# Patient Record
Sex: Male | Born: 1965 | Race: White | Hispanic: No | Marital: Married | Smoking: Never smoker
Health system: Southern US, Community
[De-identification: ages and names within clinical notes are randomized; demographics above are authoritative.]

## PROBLEM LIST (undated history)

## (undated) DIAGNOSIS — F419 Anxiety disorder, unspecified: Secondary | ICD-10-CM

## (undated) DIAGNOSIS — M1990 Unspecified osteoarthritis, unspecified site: Secondary | ICD-10-CM

## (undated) DIAGNOSIS — I10 Essential (primary) hypertension: Secondary | ICD-10-CM

## (undated) HISTORY — PX: VASECTOMY REVERSAL: SHX243

## (undated) HISTORY — PX: KNEE SURGERY: SHX244

## (undated) HISTORY — PX: HERNIA REPAIR: SHX51

---

## 2000-02-03 ENCOUNTER — Encounter: Payer: Self-pay | Admitting: Family Medicine

## 2000-02-03 ENCOUNTER — Encounter: Admission: RE | Admit: 2000-02-03 | Discharge: 2000-02-03 | Payer: Self-pay | Admitting: Family Medicine

## 2009-12-23 ENCOUNTER — Ambulatory Visit (HOSPITAL_BASED_OUTPATIENT_CLINIC_OR_DEPARTMENT_OTHER): Admission: RE | Admit: 2009-12-23 | Discharge: 2009-12-23 | Payer: Self-pay | Admitting: Urology

## 2010-03-13 ENCOUNTER — Emergency Department (HOSPITAL_COMMUNITY): Admission: EM | Admit: 2010-03-13 | Discharge: 2010-03-13 | Payer: Self-pay | Admitting: Emergency Medicine

## 2010-09-12 LAB — POCT HEMOGLOBIN-HEMACUE: Hemoglobin: 14.4 g/dL (ref 13.0–17.0)

## 2013-09-30 ENCOUNTER — Other Ambulatory Visit: Payer: Self-pay | Admitting: Family

## 2013-09-30 ENCOUNTER — Ambulatory Visit
Admission: RE | Admit: 2013-09-30 | Discharge: 2013-09-30 | Disposition: A | Discharge status: Home / Self Care | Payer: No Typology Code available for payment source | Source: Ambulatory Visit | Attending: Family | Admitting: Family

## 2013-09-30 DIAGNOSIS — M545 Low back pain, unspecified: Secondary | ICD-10-CM

## 2013-09-30 DIAGNOSIS — M25552 Pain in left hip: Secondary | ICD-10-CM

## 2013-11-11 ENCOUNTER — Ambulatory Visit
Admission: RE | Admit: 2013-11-11 | Discharge: 2013-11-11 | Disposition: A | Discharge status: Home / Self Care | Payer: Worker's Compensation | Source: Ambulatory Visit | Attending: Orthopedic Surgery | Admitting: Orthopedic Surgery

## 2013-11-11 ENCOUNTER — Other Ambulatory Visit: Payer: Self-pay | Admitting: Orthopedic Surgery

## 2013-11-11 DIAGNOSIS — S0095XA Superficial foreign body of unspecified part of head, initial encounter: Secondary | ICD-10-CM

## 2014-01-30 ENCOUNTER — Other Ambulatory Visit: Payer: Self-pay | Admitting: Orthopedic Surgery

## 2014-01-30 DIAGNOSIS — M545 Low back pain, unspecified: Secondary | ICD-10-CM

## 2014-02-05 ENCOUNTER — Ambulatory Visit
Admission: RE | Admit: 2014-02-05 | Discharge: 2014-02-05 | Disposition: A | Discharge status: Home / Self Care | Payer: Worker's Compensation | Source: Ambulatory Visit | Attending: Orthopedic Surgery | Admitting: Orthopedic Surgery

## 2014-02-05 DIAGNOSIS — M545 Low back pain, unspecified: Secondary | ICD-10-CM

## 2014-02-05 MED ORDER — METHYLPREDNISOLONE ACETATE 40 MG/ML INJ SUSP (RADIOLOG
120.0000 mg | Freq: Once | INTRAMUSCULAR | Status: AC
  Administered 2014-02-05: 120 mg via INTRA_ARTICULAR

## 2015-06-17 IMAGING — CT CT BIOPSY
1 series · 15 of 25 positions shown, 19 images · non-contrast
Comparison: none

CLINICAL DATA: Left-sided low back pain.

[Series 3: pelvis standard · axial · 0.70mm/px · z∈[-86,-35]mm · 15 of 25 slices shown, 19 images]
[im 2/25  soft-tissue]
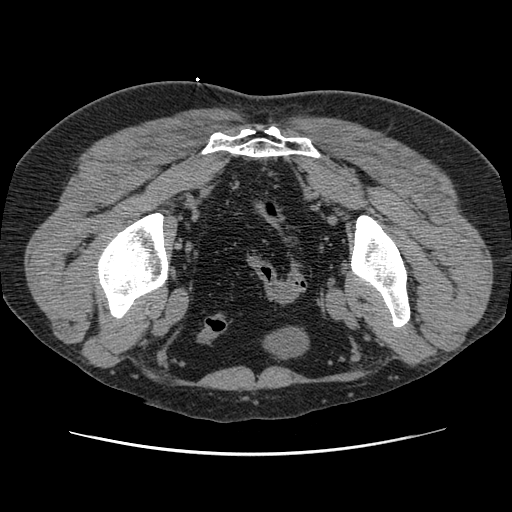
[im 2/25  bone]
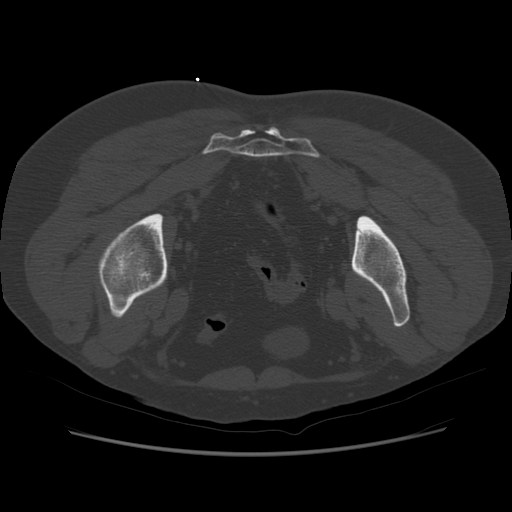
[im 4/25  soft-tissue]
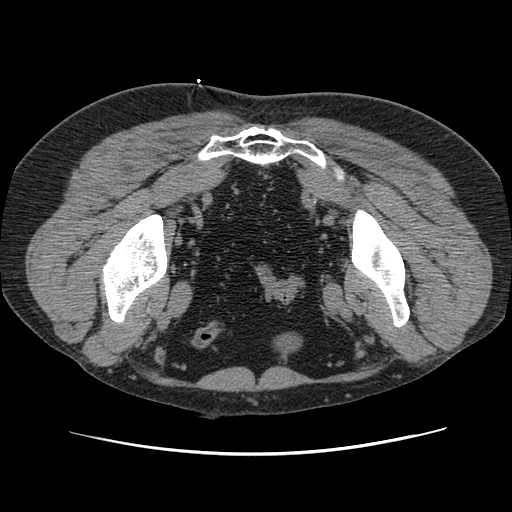
[im 6/25  soft-tissue]
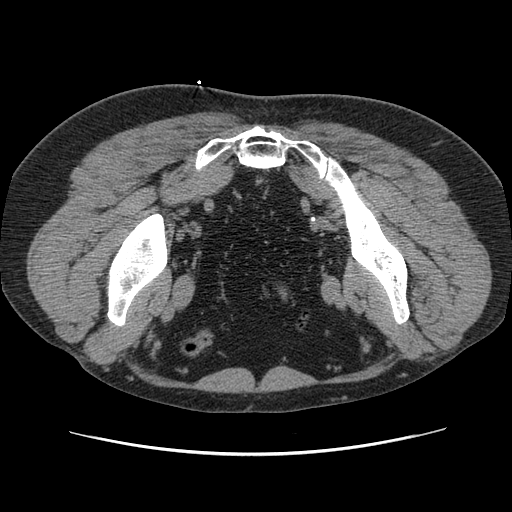
[im 8/25  soft-tissue]
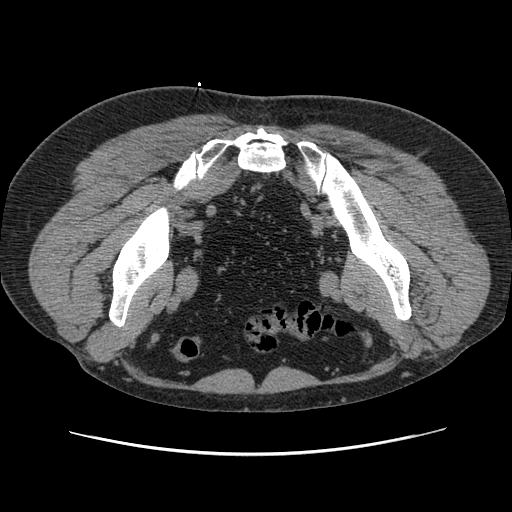
[im 9/25  soft-tissue]
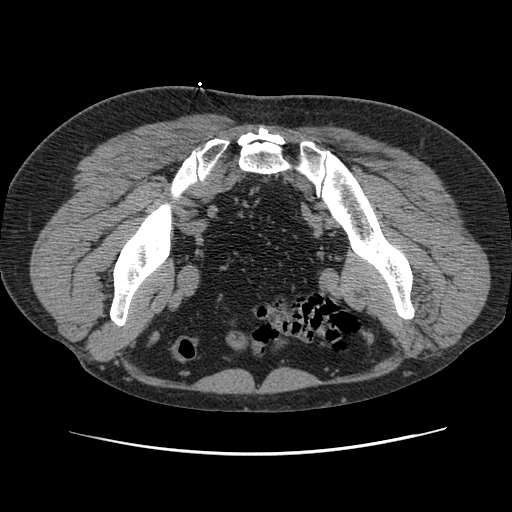
[im 11/25  soft-tissue]
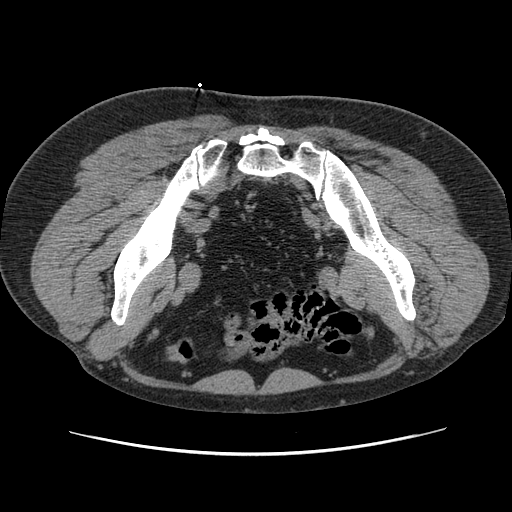
[im 13/25  soft-tissue]
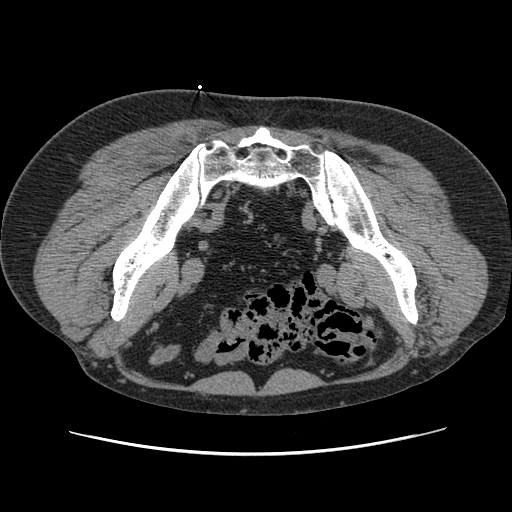
[im 15/25  soft-tissue]
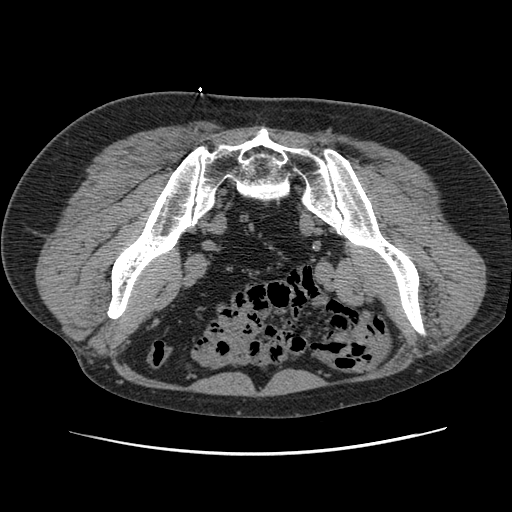
[im 17/25  soft-tissue]
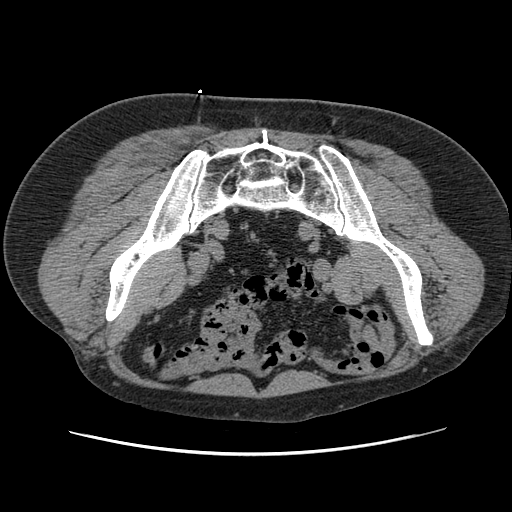
[im 17/25  bone]
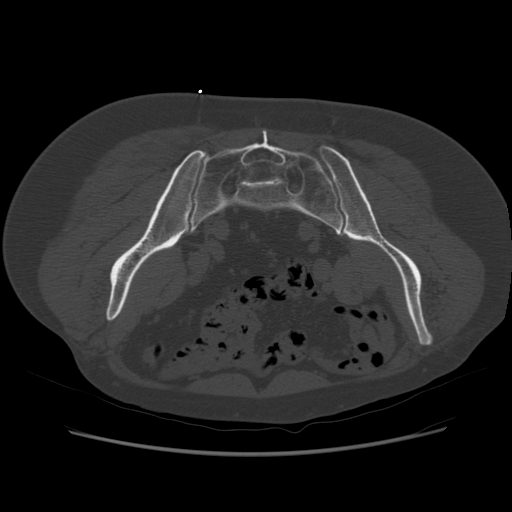
[im 18/25  soft-tissue]
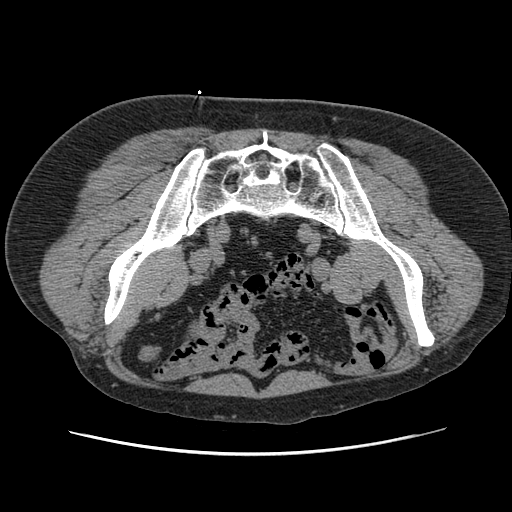
[im 20/25  soft-tissue]
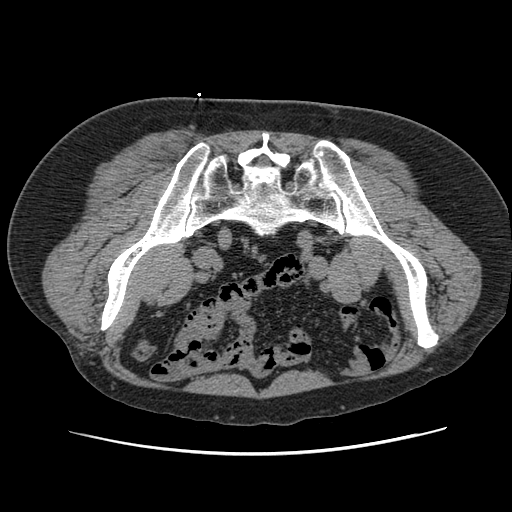
[im 21/25  lung]
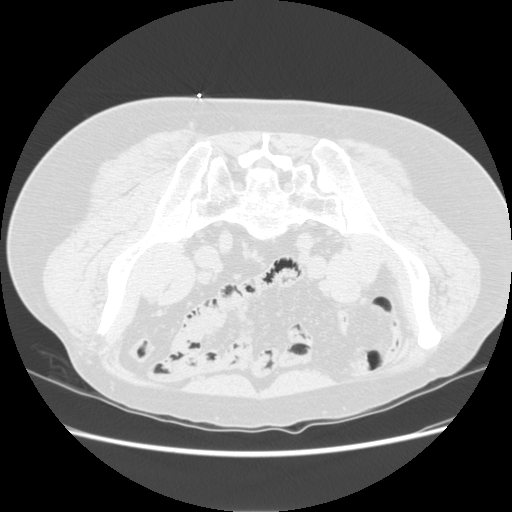
[im 22/25  soft-tissue]
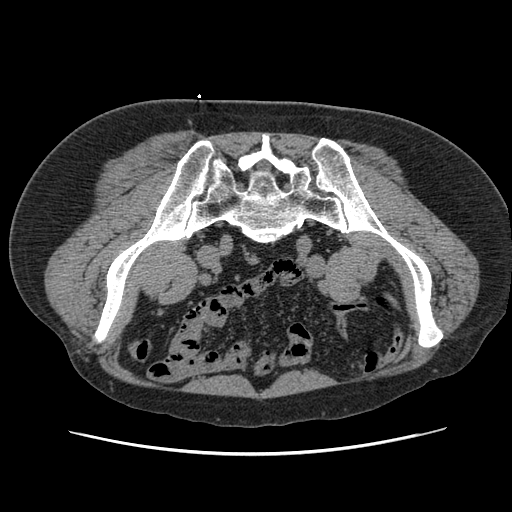
[im 22/25  lung]
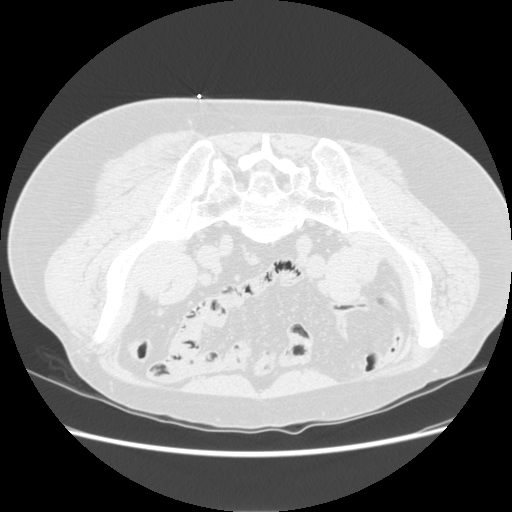
[im 23/25  lung]
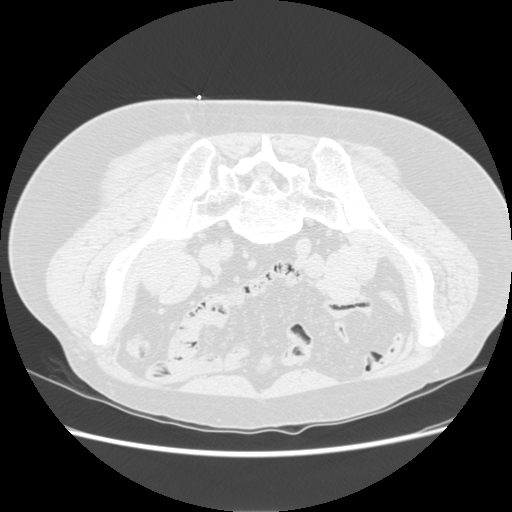
[im 24/25  soft-tissue]
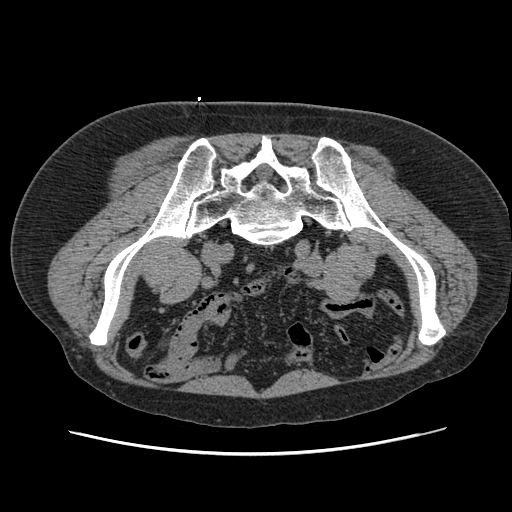
[im 24/25  lung]
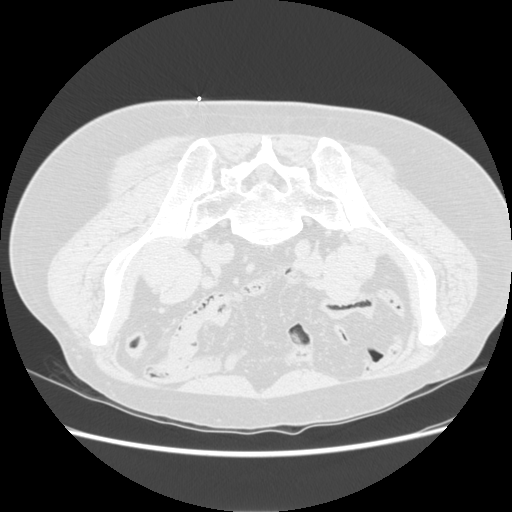

[15 of 25 positions shown; findings below may reference images not displayed]

PROCEDURE:
CT-GUIDED LEFT SI JOINT INJECTION.

After a thorough discussion of risks and benefits of the procedure,
including bleeding, infection, injury to nerves, blood vessels, and
adjacent structures, verbal and written consent was obtained.
Specific risks of the procedure included
nondiagnostic/nontherapeutic injection and non target injection. The
patient was placed prone on the CT table and localization was
performed over the sacrum. Target site marked. The skin was prepped
and draped in the usual sterile fashion using Betadine soap.

After local anesthesia with 1% lidocaine without epinephrine and
subsequent deep anesthesia, a 3.5 inch 22 gauge spinal needle was
advanced into the left SI joint under intermittent CT guidance.
Entrance into the joint was technically difficult due to overhanging
osteophytes, necessitating a medial to lateral approach into the
posterior aspect of the joint, which prevented advancement deeper
into the joint. Injection of 0.5 ml Omnipaque 180 opacified the
posterior aspect of the joint deep to the osteophytes. No vascular
uptake present. Subsequently, 120 of Depo-Medrol mixed with 0.5 mL
of 1% lidocaine was injected into SI joint. The patient's pain was
reproduced during the injection. Needle was removed and a sterile
dressing applied.

No complications were observed. The patient was observed and
released under the care of a driver after 30 minutes.
IMPRESSION: Successful CT-guided left SI joint injection.

## 2016-06-14 ENCOUNTER — Ambulatory Visit: Payer: Self-pay | Admitting: Urology

## 2017-02-02 ENCOUNTER — Ambulatory Visit: Payer: Self-pay | Admitting: General Surgery

## 2017-02-07 ENCOUNTER — Ambulatory Visit: Payer: Self-pay | Admitting: General Surgery

## 2017-03-01 NOTE — Progress Notes (Addendum)
SUN:HRVACQP, Herbie [place], MD  Cardiologist: pt denies  EKG: pt denies past year  Stress test: pt denies ever  ECHO: pt denies ever  Cardiac Cath: pt denies ever  Chest x-ray: pt denies past year

## 2017-03-01 NOTE — Pre-Procedure Instructions (Addendum)
Reginald Odom  03/01/2017      [place] [place] - [id number] [place] [address] Phone: [phone] Fax: [phone]    Your procedure is scheduled on March 06, 2017.Marland Kitchen  Report to [facility] Admitting at 1230 PM.  Call this number if you have problems the morning of surgery:  [phone]   Remember:  Do not eat food or drink liquids after midnight.  Take these medicines the morning of surgery with A SIP OF WATER acetaminophen (tylenol), alprazolam (xanax)-if needed.  7 days prior to surgery STOP taking any Aspirin, Aleve, Naproxen, Ibuprofen, Motrin, Advil, Goody's, BC's, all herbal medications, fish oil, and all vitamins   Do not wear jewelry, make-up or nail polish.  Do not wear lotions, powders, or perfumes, or deoderant.  Men may shave face and neck.  Do not bring valuables to the hospital.  [facility] is not responsible for any belongings or valuables.  Contacts, dentures or bridgework may not be worn into surgery.  Leave your suitcase in the car.  After surgery it may be brought to your room.  For patients admitted to the hospital, discharge time will be determined by your treatment team.  Patients discharged the day of surgery will not be allowed to drive home.   Special instructions:   [place]- Preparing For Surgery  Before surgery, you can play an important role. Because skin is not sterile, your skin needs to be as free of germs as possible. You can reduce the number of germs on your skin by washing with CHG (chlorahexidine gluconate) Soap before surgery.  CHG is an antiseptic cleaner which kills germs and bonds with the skin to continue killing germs even after washing.  Please do not use if you have an allergy to CHG or antibacterial soaps. If your skin becomes reddened/irritated stop using the CHG.  Do not shave (including legs and underarms) for at least 48 hours prior to first CHG  shower. It is OK to shave your face.  Please follow these instructions carefully.   1. Shower the NIGHT BEFORE SURGERY and the MORNING OF SURGERY with CHG.   2. If you chose to wash your hair, wash your hair first as usual with your normal shampoo.  3. After you shampoo, rinse your hair and body thoroughly to remove the shampoo.  4. Use CHG as you would any other liquid soap. You can apply CHG directly to the skin and wash gently with a scrungie or a clean washcloth.   5. Apply the CHG Soap to your body ONLY FROM THE NECK DOWN.  Do not use on open wounds or open sores. Avoid contact with your eyes, ears, mouth and genitals (private parts). Wash genitals (private parts) with your normal soap.  6. Wash thoroughly, paying special attention to the area where your surgery will be performed.  7. Thoroughly rinse your body with warm water from the neck down.  8. DO NOT shower/wash with your normal soap after using and rinsing off the CHG Soap.  9. Pat yourself dry with a CLEAN TOWEL.   10. Wear CLEAN PAJAMAS   11. Place CLEAN SHEETS on your bed the night of your first shower and DO NOT SLEEP WITH PETS.    Day of Surgery: Do not apply any deodorants/lotions. Please wear clean clothes to the hospital/surgery center.     Please read over the following fact sheets that you were  given. Pain Booklet, Coughing and Deep Breathing and Surgical Site Infection Prevention

## 2017-03-02 ENCOUNTER — Encounter (HOSPITAL_COMMUNITY)
Admission: RE | Admit: 2017-03-02 | Discharge: 2017-03-02 | Disposition: A | Discharge status: Home / Self Care | Payer: 59 | Source: Ambulatory Visit | Attending: General Surgery | Admitting: General Surgery

## 2017-03-02 ENCOUNTER — Encounter (HOSPITAL_COMMUNITY): Payer: Self-pay

## 2017-03-02 DIAGNOSIS — Z01810 Encounter for preprocedural cardiovascular examination: Secondary | ICD-10-CM | POA: Diagnosis not present

## 2017-03-02 DIAGNOSIS — K4091 Unilateral inguinal hernia, without obstruction or gangrene, recurrent: Secondary | ICD-10-CM | POA: Insufficient documentation

## 2017-03-02 HISTORY — DX: Essential (primary) hypertension: I10

## 2017-03-02 HISTORY — DX: Anxiety disorder, unspecified: F41.9

## 2017-03-02 HISTORY — DX: Unspecified osteoarthritis, unspecified site: M19.90

## 2017-03-02 LAB — BASIC METABOLIC PANEL
ANION GAP: 8 (ref 5–15)
BUN: 11 mg/dL (ref 6–20)
CALCIUM: 9 mg/dL (ref 8.9–10.3)
CO2: 28 mmol/L (ref 22–32)
Chloride: 102 mmol/L (ref 101–111)
Creatinine, Ser: 0.91 mg/dL (ref 0.61–1.24)
GFR calc Af Amer: >60 mL/min (ref >60)
GFR calc non Af Amer: >60 mL/min (ref >60)
Glucose, Bld: 100 mg/dL — ABNORMAL HIGH (ref 65–99)
Potassium: 3.8 mmol/L (ref 3.5–5.1)
SODIUM: 138 mmol/L (ref 135–145)

## 2017-03-02 LAB — CBC
HCT: 49.1 % (ref 39.0–52.0)
Hemoglobin: 16.4 g/dL (ref 13.0–17.0)
MCH: 28 pg (ref 26.0–34.0)
MCHC: 33.4 g/dL (ref 30.0–36.0)
MCV: 83.9 fL (ref 78.0–100.0)
PLATELETS: 168 10*3/uL (ref 150–400)
RBC: 5.85 MIL/uL — ABNORMAL HIGH (ref 4.22–5.81)
RDW: 12.8 % (ref 11.5–15.5)
WBC: 6.3 10*3/uL (ref 4.0–10.5)

## 2017-03-05 MED ORDER — ACETAMINOPHEN 500 MG PO TABS
1000.0000 mg | ORAL_TABLET | ORAL | Status: AC
  Administered 2017-03-06: 1000 mg via ORAL
  Filled 2017-03-05: qty 2

## 2017-03-05 MED ORDER — GABAPENTIN 300 MG PO CAPS
300.0000 mg | ORAL_CAPSULE | ORAL | Status: AC
  Administered 2017-03-06: 300 mg via ORAL
  Filled 2017-03-05: qty 1

## 2017-03-05 MED ORDER — CELECOXIB 200 MG PO CAPS
400.0000 mg | ORAL_CAPSULE | ORAL | Status: AC
  Administered 2017-03-06: 400 mg via ORAL
  Filled 2017-03-05: qty 2

## 2017-03-06 ENCOUNTER — Ambulatory Visit (HOSPITAL_COMMUNITY)
Admission: RE | Admit: 2017-03-06 | Discharge: 2017-03-06 | Disposition: A | Discharge status: Home / Self Care | Payer: 59 | Source: Ambulatory Visit | Attending: General Surgery | Admitting: General Surgery

## 2017-03-06 ENCOUNTER — Ambulatory Visit (HOSPITAL_COMMUNITY): Payer: 59 | Admitting: Certified Registered Nurse Anesthetist

## 2017-03-06 ENCOUNTER — Encounter (HOSPITAL_COMMUNITY): Payer: Self-pay | Admitting: Certified Registered Nurse Anesthetist

## 2017-03-06 ENCOUNTER — Encounter (HOSPITAL_COMMUNITY)
Admission: RE | Disposition: A | Discharge status: Home / Self Care | Payer: Self-pay | Source: Ambulatory Visit | Attending: General Surgery

## 2017-03-06 DIAGNOSIS — Z8673 Personal history of transient ischemic attack (TIA), and cerebral infarction without residual deficits: Secondary | ICD-10-CM | POA: Diagnosis not present

## 2017-03-06 DIAGNOSIS — D176 Benign lipomatous neoplasm of spermatic cord: Secondary | ICD-10-CM | POA: Diagnosis not present

## 2017-03-06 DIAGNOSIS — K4091 Unilateral inguinal hernia, without obstruction or gangrene, recurrent: Secondary | ICD-10-CM | POA: Insufficient documentation

## 2017-03-06 DIAGNOSIS — J45909 Unspecified asthma, uncomplicated: Secondary | ICD-10-CM | POA: Insufficient documentation

## 2017-03-06 DIAGNOSIS — M1990 Unspecified osteoarthritis, unspecified site: Secondary | ICD-10-CM | POA: Insufficient documentation

## 2017-03-06 DIAGNOSIS — E079 Disorder of thyroid, unspecified: Secondary | ICD-10-CM | POA: Insufficient documentation

## 2017-03-06 DIAGNOSIS — R011 Cardiac murmur, unspecified: Secondary | ICD-10-CM | POA: Insufficient documentation

## 2017-03-06 DIAGNOSIS — K219 Gastro-esophageal reflux disease without esophagitis: Secondary | ICD-10-CM | POA: Insufficient documentation

## 2017-03-06 DIAGNOSIS — Z833 Family history of diabetes mellitus: Secondary | ICD-10-CM | POA: Diagnosis not present

## 2017-03-06 DIAGNOSIS — M549 Dorsalgia, unspecified: Secondary | ICD-10-CM | POA: Diagnosis not present

## 2017-03-06 DIAGNOSIS — Z8249 Family history of ischemic heart disease and other diseases of the circulatory system: Secondary | ICD-10-CM | POA: Insufficient documentation

## 2017-03-06 DIAGNOSIS — Z79899 Other long term (current) drug therapy: Secondary | ICD-10-CM | POA: Insufficient documentation

## 2017-03-06 DIAGNOSIS — I110 Hypertensive heart disease with heart failure: Secondary | ICD-10-CM | POA: Insufficient documentation

## 2017-03-06 DIAGNOSIS — I4891 Unspecified atrial fibrillation: Secondary | ICD-10-CM | POA: Diagnosis not present

## 2017-03-06 DIAGNOSIS — Z880 Allergy status to penicillin: Secondary | ICD-10-CM | POA: Diagnosis not present

## 2017-03-06 HISTORY — PX: INGUINAL HERNIA REPAIR: SHX194

## 2017-03-06 HISTORY — PX: INSERTION OF MESH: SHX5868

## 2017-03-06 SURGERY — REPAIR, HERNIA, INGUINAL, ADULT
Anesthesia: General | Site: Abdomen | Laterality: Left

## 2017-03-06 MED ORDER — FENTANYL CITRATE (PF) 250 MCG/5ML IJ SOLN
INTRAMUSCULAR | Status: AC
  Filled 2017-03-06: qty 5

## 2017-03-06 MED ORDER — DEXAMETHASONE SODIUM PHOSPHATE 10 MG/ML IJ SOLN
INTRAMUSCULAR | Status: DC | PRN
  Administered 2017-03-06: 10 mg via INTRAVENOUS

## 2017-03-06 MED ORDER — PROPOFOL 10 MG/ML IV BOLUS
INTRAVENOUS | Status: AC
  Filled 2017-03-06: qty 20

## 2017-03-06 MED ORDER — PROPOFOL 10 MG/ML IV BOLUS
INTRAVENOUS | Status: DC | PRN
  Administered 2017-03-06: 200 mg via INTRAVENOUS

## 2017-03-06 MED ORDER — FENTANYL CITRATE (PF) 100 MCG/2ML IJ SOLN
INTRAMUSCULAR | Status: DC | PRN
  Administered 2017-03-06: 50 ug via INTRAVENOUS
  Administered 2017-03-06 (×2): 100 ug via INTRAVENOUS

## 2017-03-06 MED ORDER — EPHEDRINE 5 MG/ML INJ
INTRAVENOUS | Status: AC
  Filled 2017-03-06: qty 10

## 2017-03-06 MED ORDER — BUPIVACAINE-EPINEPHRINE (PF) 0.25% -1:200000 IJ SOLN
INTRAMUSCULAR | Status: AC
  Filled 2017-03-06: qty 30

## 2017-03-06 MED ORDER — CHLORHEXIDINE GLUCONATE CLOTH 2 % EX PADS: 6.0000 | MEDICATED_PAD | Freq: Once | CUTANEOUS | Status: DC

## 2017-03-06 MED ORDER — NEOSTIGMINE METHYLSULFATE 5 MG/5ML IV SOSY
PREFILLED_SYRINGE | INTRAVENOUS | Status: AC
  Filled 2017-03-06: qty 5

## 2017-03-06 MED ORDER — NEOSTIGMINE METHYLSULFATE 10 MG/10ML IV SOLN
INTRAVENOUS | Status: DC | PRN
  Administered 2017-03-06: 4 mg via INTRAVENOUS

## 2017-03-06 MED ORDER — OXYCODONE HCL 5 MG PO TABS
5.0000 mg | ORAL_TABLET | Freq: Once | ORAL | Status: AC
  Administered 2017-03-06: 5 mg via ORAL

## 2017-03-06 MED ORDER — MIDAZOLAM HCL 5 MG/5ML IJ SOLN
INTRAMUSCULAR | Status: DC | PRN
  Administered 2017-03-06: 2 mg via INTRAVENOUS

## 2017-03-06 MED ORDER — LIDOCAINE HCL (CARDIAC) 20 MG/ML IV SOLN
INTRAVENOUS | Status: DC | PRN
  Administered 2017-03-06: 80 mg via INTRAVENOUS

## 2017-03-06 MED ORDER — 0.9 % SODIUM CHLORIDE (POUR BTL) OPTIME
TOPICAL | Status: DC | PRN
  Administered 2017-03-06: 1000 mL

## 2017-03-06 MED ORDER — GLYCOPYRROLATE 0.2 MG/ML IJ SOLN
INTRAMUSCULAR | Status: DC | PRN
  Administered 2017-03-06: 0.6 mg via INTRAVENOUS

## 2017-03-06 MED ORDER — MIDAZOLAM HCL 2 MG/2ML IJ SOLN
INTRAMUSCULAR | Status: AC
  Filled 2017-03-06: qty 2

## 2017-03-06 MED ORDER — MEPERIDINE HCL 25 MG/ML IJ SOLN: 6.2500 mg | INTRAMUSCULAR | Status: DC | PRN

## 2017-03-06 MED ORDER — BUPIVACAINE-EPINEPHRINE (PF) 0.5% -1:200000 IJ SOLN
INTRAMUSCULAR | Status: AC
  Filled 2017-03-06: qty 30

## 2017-03-06 MED ORDER — BUPIVACAINE-EPINEPHRINE 0.25% -1:200000 IJ SOLN
INTRAMUSCULAR | Status: DC | PRN
  Administered 2017-03-06: 20 mL

## 2017-03-06 MED ORDER — CIPROFLOXACIN IN D5W 400 MG/200ML IV SOLN
400.0000 mg | Freq: Once | INTRAVENOUS | Status: AC
  Administered 2017-03-06: 400 mg via INTRAVENOUS
  Filled 2017-03-06: qty 200

## 2017-03-06 MED ORDER — OXYCODONE HCL 5 MG PO TABS
ORAL_TABLET | ORAL | Status: AC
  Administered 2017-03-06: 5 mg via ORAL
  Filled 2017-03-06: qty 1

## 2017-03-06 MED ORDER — CIPROFLOXACIN IN D5W 400 MG/200ML IV SOLN
INTRAVENOUS | Status: AC
  Filled 2017-03-06: qty 200

## 2017-03-06 MED ORDER — LACTATED RINGERS IV SOLN
INTRAVENOUS | Status: DC
Start: 2017-03-06 — End: 2017-03-06
  Administered 2017-03-06: 14:00:00 via INTRAVENOUS

## 2017-03-06 MED ORDER — PHENYLEPHRINE 40 MCG/ML (10ML) SYRINGE FOR IV PUSH (FOR BLOOD PRESSURE SUPPORT)
PREFILLED_SYRINGE | INTRAVENOUS | Status: AC
  Filled 2017-03-06: qty 10

## 2017-03-06 MED ORDER — ROCURONIUM BROMIDE 100 MG/10ML IV SOLN
INTRAVENOUS | Status: DC | PRN
  Administered 2017-03-06: 50 mg via INTRAVENOUS

## 2017-03-06 MED ORDER — FENTANYL CITRATE (PF) 100 MCG/2ML IJ SOLN: 25.0000 ug | INTRAMUSCULAR | Status: DC | PRN

## 2017-03-06 MED ORDER — OXYCODONE HCL 5 MG PO TABS: 5.0000 mg | ORAL_TABLET | Freq: Four times a day (QID) | ORAL | 0 refills | Status: AC | PRN

## 2017-03-06 MED ORDER — ONDANSETRON HCL 4 MG/2ML IJ SOLN: 4.0000 mg | Freq: Once | INTRAMUSCULAR | Status: DC | PRN

## 2017-03-06 MED ORDER — ONDANSETRON HCL 4 MG/2ML IJ SOLN
INTRAMUSCULAR | Status: DC | PRN
  Administered 2017-03-06: 4 mg via INTRAVENOUS

## 2017-03-06 SURGICAL SUPPLY — 47 items
ADH SKN CLS APL DERMABOND .7 (GAUZE/BANDAGES/DRESSINGS) ×1
BLADE CLIPPER SURG (BLADE) IMPLANT
BLADE SURG 10 STRL SS (BLADE) ×3 IMPLANT
BLADE SURG 15 STRL LF DISP TIS (BLADE) ×1 IMPLANT
BLADE SURG 15 STRL SS (BLADE) ×3
CANISTER SUCT 3000ML PPV (MISCELLANEOUS) IMPLANT
CHLORAPREP W/TINT 26ML (MISCELLANEOUS) ×3 IMPLANT
COVER SURGICAL LIGHT HANDLE (MISCELLANEOUS) ×3 IMPLANT
DERMABOND ADVANCED (GAUZE/BANDAGES/DRESSINGS) ×2
DERMABOND ADVANCED .7 DNX12 (GAUZE/BANDAGES/DRESSINGS) ×1 IMPLANT
DRAIN PENROSE 1/2X12 LTX STRL (WOUND CARE) IMPLANT
DRAPE LAPAROTOMY 100X72 PEDS (DRAPES) ×3 IMPLANT
DRAPE UTILITY XL STRL (DRAPES) ×3 IMPLANT
ELECT CAUTERY BLADE 6.4 (BLADE) ×3 IMPLANT
ELECT REM PT RETURN 9FT ADLT (ELECTROSURGICAL) ×3
ELECTRODE REM PT RTRN 9FT ADLT (ELECTROSURGICAL) ×1 IMPLANT
GLOVE BIO SURGEON STRL SZ8 (GLOVE) ×3 IMPLANT
GLOVE BIOGEL PI IND STRL 8 (GLOVE) ×1 IMPLANT
GLOVE BIOGEL PI INDICATOR 8 (GLOVE) ×2
GOWN STRL REUS W/ TWL LRG LVL3 (GOWN DISPOSABLE) ×1 IMPLANT
GOWN STRL REUS W/ TWL XL LVL3 (GOWN DISPOSABLE) ×1 IMPLANT
GOWN STRL REUS W/TWL LRG LVL3 (GOWN DISPOSABLE) ×3
GOWN STRL REUS W/TWL XL LVL3 (GOWN DISPOSABLE) ×3
KIT BASIN OR (CUSTOM PROCEDURE TRAY) ×3 IMPLANT
KIT ROOM TURNOVER OR (KITS) ×3 IMPLANT
MESH HERNIA 3X6 (Mesh General) ×2 IMPLANT
NEEDLE 22X1 1/2 (OR ONLY) (NEEDLE) ×3 IMPLANT
NS IRRIG 1000ML POUR BTL (IV SOLUTION) ×3 IMPLANT
PACK SURGICAL SETUP 50X90 (CUSTOM PROCEDURE TRAY) ×3 IMPLANT
PAD ARMBOARD 7.5X6 YLW CONV (MISCELLANEOUS) ×3 IMPLANT
PENCIL BUTTON HOLSTER BLD 10FT (ELECTRODE) ×3 IMPLANT
SPECIMEN JAR SMALL (MISCELLANEOUS) IMPLANT
SPONGE LAP 18X18 X RAY DECT (DISPOSABLE) ×3 IMPLANT
SUT MNCRL AB 4-0 PS2 18 (SUTURE) ×3 IMPLANT
SUT PROLENE 2 0 CT2 30 (SUTURE) ×9 IMPLANT
SUT VIC AB 2-0 SH 27 (SUTURE) ×6
SUT VIC AB 2-0 SH 27X BRD (SUTURE) ×1 IMPLANT
SUT VIC AB 3-0 SH 27 (SUTURE) ×3
SUT VIC AB 3-0 SH 27X BRD (SUTURE) ×1 IMPLANT
SUT VICRYL AB 3 0 TIES (SUTURE) IMPLANT
SYR BULB 3OZ (MISCELLANEOUS) ×3 IMPLANT
SYR CONTROL 10ML LL (SYRINGE) ×3 IMPLANT
TOWEL OR 17X24 6PK STRL BLUE (TOWEL DISPOSABLE) ×3 IMPLANT
TOWEL OR 17X26 10 PK STRL BLUE (TOWEL DISPOSABLE) ×3 IMPLANT
TUBE CONNECTING 12'X1/4 (SUCTIONS)
TUBE CONNECTING 12X1/4 (SUCTIONS) IMPLANT
YANKAUER SUCT BULB TIP NO VENT (SUCTIONS) IMPLANT

## 2017-03-06 NOTE — Anesthesia Preprocedure Evaluation (Signed)
Anesthesia Evaluation  Patient identified by MRN, date of birth, ID band Patient awake    Reviewed: Allergy & Precautions, NPO status , Patient's Chart, lab work & pertinent test results  Airway Mallampati: II  TM Distance: >3 FB Neck ROM: Full    Dental no notable dental hx.    Pulmonary neg pulmonary ROS,    Pulmonary exam normal breath sounds clear to auscultation       Cardiovascular hypertension, negative cardio ROS Normal cardiovascular exam Rhythm:Regular Rate:Normal     Neuro/Psych negative neurological ROS  negative psych ROS   GI/Hepatic negative GI ROS, Neg liver ROS,   Endo/Other  negative endocrine ROS  Renal/GU negative Renal ROS  negative genitourinary   Musculoskeletal negative musculoskeletal ROS (+)   Abdominal   Peds negative pediatric ROS (+)  Hematology negative hematology ROS (+)   Anesthesia Other Findings   Reproductive/Obstetrics negative OB ROS                            Anesthesia Physical Anesthesia Plan  ASA: II  Anesthesia Plan: General   Post-op Pain Management:    Induction: Intravenous  PONV Risk Score and Plan: 2 and Ondansetron, Dexamethasone and Treatment may vary due to age or medical condition  Airway Management Planned: Oral ETT  Additional Equipment:   Intra-op Plan:   Post-operative Plan: Extubation in OR  Informed Consent:   Plan Discussed with:   Anesthesia Plan Comments: (  )        Anesthesia Quick Evaluation

## 2017-03-06 NOTE — H&P (Signed)
Reginald Odom 02/01/2017 9:38 AM Location: [facility] Patient #: [id number] DOB: 23-Jun-1966 Married / Language: Reginald Odom / Race: White Male   History of Present Illness Reginald Neri E. Grandville Silos MD; 02/01/2017 9:57 AM) The patient is a 51 year old male who presents with an inguinal hernia. I was asked to see Reginald Odom in consultation by Dr. Franchot Gallo in regards to a recurrent left inguinal hernia. He initially had it repaired when he was an infant. 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 He had associated pain and tingling down his left leg. He underwent a lot of testing to rule out any kind of back injury regarding this. Nothing changed his symptoms. Dr. Diona Fanti is his urologist and he is status post vasectomy and subsequent reversal. He was found on exam to have an inguinal hernia on the left side. When the hernia gives him some discomfort in his groin, he has been associated numbness along his left thigh extending down to his foot.   Past Surgical History Reginald Odom, [place]; 02/01/2017 9:39 AM) Open Inguinal Hernia Surgery  Bilateral.  Diagnostic Studies History Reginald Odom, RMA; 02/01/2017 9:39 AM) Colonoscopy  within last year  Allergies Reginald Odom, RMA; 02/01/2017 9:41 AM) Penicillins  Anaphylaxis.  Medication History Reginald Odom, [place]; 02/01/2017 9:42 AM) Testosterone Cypionate (200MG /ML Solution, 100 MG\ML.8ml Intramuscular) Active. ALPRAZolam (1MG  Tablet, Oral) Active. HydroCHLOROthiazide (25MG  Tablet, Oral) Active. Medications Reconciled  Social History Reginald Odom, [place]; 02/01/2017 9:39 AM) Alcohol use  Occasional alcohol use. Caffeine use  Coffee. No drug use  Tobacco use  Never smoker.  Family History Reginald Odom, [place]; 02/01/2017 9:39 AM) Diabetes Mellitus  Son. Hypertension   Mother.  Other Problems Reginald Odom, RMA; 02/01/2017 9:39 AM) Alcohol Abuse  Arthritis  Asthma  Atrial Fibrillation  Back Pain  Cerebrovascular Accident  Congestive Heart Failure  Gastroesophageal Reflux Disease  Heart murmur  Hemorrhoids  Hepatitis  Inguinal Hernia  Thyroid Disease  Ulcerative Colitis     Review of Systems Reginald Odom RMA; 02/01/2017 9:39 AM) General Not Present- Appetite Loss, Chills, Fatigue, Fever, Night Sweats, Weight Gain and Weight Loss. Skin Not Present- Change in Wart/Mole, Dryness, Hives, Jaundice, New Lesions, Non-Healing Wounds, Rash and Ulcer. HEENT Not Present- Earache, Hearing Loss, Hoarseness, Nose Bleed, Oral Ulcers, Ringing in the Ears, Seasonal Allergies, Sinus Pain, Sore Throat, Visual Disturbances, Wears glasses/contact lenses and Yellow Eyes. Breast Not Present- Breast Mass, Breast Pain, Nipple Discharge and Skin Changes. Cardiovascular Not Present- Chest Pain, Difficulty Breathing Lying Down, Leg Cramps, Palpitations, Rapid Heart Rate, Shortness of Breath and Swelling of Extremities. Gastrointestinal Not Present- Abdominal Pain, Bloating, Bloody Stool, Change in Bowel Habits, Chronic diarrhea, Constipation, Difficulty Swallowing, Excessive gas, Gets full quickly at meals, Hemorrhoids, Indigestion, Nausea, Rectal Pain and Vomiting. Male Genitourinary Not Present- Blood in Urine, Change in Urinary Stream, Frequency, Impotence, Nocturia, Painful Urination, Urgency and Urine Leakage. Musculoskeletal Not Present- Back Pain, Joint Pain, Joint Stiffness, Muscle Pain, Muscle Weakness and Swelling of Extremities. Neurological Not Present- Decreased Memory, Fainting, Headaches, Numbness, Seizures, Tingling, Tremor, Trouble walking and Weakness. Psychiatric Not Present- Anxiety, Bipolar, Change in Sleep Pattern, Depression, Fearful and Frequent crying. Endocrine Not Present- Cold Intolerance, Excessive Hunger, Hair Changes, Heat  Intolerance, Hot flashes and New Diabetes. Hematology Not Present- Blood Thinners, Easy Bruising, Excessive bleeding, Gland problems, HIV and Persistent Infections.  Vitals Reginald Odom RMA;  02/01/2017 9:43 AM) 02/01/2017 9:42 AM Weight: 193.8 lb Height: 74in Body Surface Area: 2.14 m Body Mass Index: 24.88 kg/m  Temp.: 97.80F  Pulse: 77 (Regular)  BP: 138/90 (Sitting, Left Arm, Standard)       Physical Exam Reginald Neri E. Grandville Silos MD; 02/01/2017 9:58 AM) General Mental Status-Alert. General Appearance-Consistent with stated age. Hydration-Well hydrated. Voice-Normal.  Head and Neck Head-normocephalic, atraumatic with no lesions or palpable masses. Trachea-midline. Thyroid Gland Characteristics - normal size and consistency.  Eye Eyeball - Bilateral-Extraocular movements intact. Sclera/Conjunctiva - Bilateral-No scleral icterus.  Chest and Lung Exam Chest and lung exam reveals -quiet, even and easy respiratory effort with no use of accessory muscles and on auscultation, normal breath sounds, no adventitious sounds and normal vocal resonance. Inspection Chest Wall - Normal. Back - normal.  Cardiovascular Cardiovascular examination reveals -normal heart sounds, regular rate and rhythm with no murmurs and normal pedal pulses bilaterally.  Abdomen Inspection Inspection of the abdomen reveals - No Hernias. Palpation/Percussion Palpation and Percussion of the abdomen reveal - Soft, Non Tender, No Rebound tenderness, No Rigidity (guarding) and No hepatosplenomegaly. Auscultation Auscultation of the abdomen reveals - Bowel sounds normal.  Male Genitourinary Note: Penis is normal, testes are descended bilaterally without edema, no evidence of right inguinal hernia, he has a moderate-sized left inguinal hernia which does not extend into his scrotum. It reduces but recurs immediately. Manipulating the hernia reproduces the numbness on his  leg.   Neurologic Neurologic evaluation reveals -alert and oriented x 3 with no impairment of recent or remote memory. Mental Status-Normal.  Musculoskeletal Global Assessment -Note: no gross deformities.  Normal Exam - Left-Upper Extremity Strength Normal and Lower Extremity Strength Normal. Normal Exam - Right-Upper Extremity Strength Normal and Lower Extremity Strength Normal.  Lymphatic Head & Neck  General Head & Neck Lymphatics: Bilateral - Description - Normal. Axillary  General Axillary Region: Bilateral - Description - Normal. Tenderness - Non Tender. Femoral & Inguinal  Generalized Femoral & Inguinal Lymphatics: Bilateral - Description - No Generalized lymphadenopathy.    Assessment & Plan Reginald Neri E. 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 Procedure, risks, and benefits were all discussed with him. I discussed that the chances of symptom improvement are good. I also discussed the postoperative period and his need to not lift anything over 10 pounds for 6 weeks. 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 He is agreeable.  9/10 re-examined - no changes. Site marked.  Georganna Skeans, MD, MPH, FACS Trauma: [phone] General Surgery: [phone]

## 2017-03-06 NOTE — Op Note (Signed)
03/06/2017  3:52 PM  PATIENT:  Reginald Odom  51 y.o. male  PRE-OPERATIVE DIAGNOSIS:  recurrent left inguinal hernia  POST-OPERATIVE DIAGNOSIS:  recurrent left inguinal hernia  PROCEDURE:  Procedure(s): REPAIR RECURRENT LEFT INGUINAL HERNIA INSERTION OF MESH  SURGEON:  Surgeon(s): Georganna Skeans, MD  ASSISTANTS: none   ANESTHESIA:   local and general  EBL:  No intake/output data recorded.  BLOOD ADMINISTERED:none  DRAINS: none   SPECIMEN:  No Specimen  DISPOSITION OF SPECIMEN:  N/A  COUNTS:  YES  DICTATION: .Dragon Dictation Findings: Indirect hernia, cord lipoma  Procedure in detail: [name] presents for repair of recurrent left inguinal hernia with mesh. He was identified in the preop holding area. Informed consent was obtained. His site was marked. He received intravenous antibiotics. He was brought to the operating room and general endotracheal anesthesia was administered by the anesthesia staff. His abdomen and groins were prepped and draped in sterile fashion. We did a time out procedure. Local anesthetic was injected along the planned left inguinal incision. Left inguinal incision was made. Subcutaneous tissues were dissected down through Scarpa's fascia revealing the external oblique fascia. Hemostasis was obtained in the subcutaneous tissues using cautery. The external oblique fascia was divided laterally in the division continued down medially to the external ring. The superior leaflet of the external oblique was dissected free off transversalis. The inferior leaflet was dissected down revealing the shelving edge of inguinal ligament. Cord structures were encircled with a Penrose drain. The cord was then gently dissected. There was a large cord lipoma which was excised and discarded. There was a small indirect hernia sac. The hernia had reduced spontaneously. The sac was high ligated with a 2-0 Vicryl. The remainder the cord structures were intact. The hernia repair was  completed with a keyhole polypropylene mesh cut to custom size shape and size. This was tacked to the tissues over the pubic tubercle medially using 2-0 Prolene. The inferior edge was sutured to the shelving edge of inguinal ligament with running 2-0 Prolene. This appear portion of the mesh was tacked down to the tissues over the pubic tubercle and to the transversalis with interrupted 2-0 Prolene. The 2 leaflets were rejoined behind the cord and tucked under the external oblique laterally. These leaflets were tacked together and to the underlying musculature with interrupted 2-0 Prolene. Cord structures remained viable and nonedematous. The aperture in the mesh admitted the tip of the fifth digit. Area was copiously irrigated. Additional local was injected. Hemostasis was ensured. External oblique fascia was closed with running 2-0 Vicryl. Scarpa's fascia was closed with interrupted 3-0 Vicryl. Skin was closed with running 4-0 Monocryl subcuticular followed by Dermabond. All counts were correct. He tolerated the procedure well without apparent complications. His left testicle was relocated to anatomic position in the scrotum. He was then taken recovery in stable condition. PATIENT DISPOSITION:  PACU - hemodynamically stable.   Delay start of Pharmacological VTE agent (>24hrs) due to surgical blood loss or risk of bleeding:  no  Georganna Skeans, MD, MPH, FACS Pager: [phone]  9/10/20183:52 PM

## 2017-03-06 NOTE — Interval H&P Note (Signed)
History and Physical Interval Note:  03/06/2017 2:27 PM  Reginald Odom  has presented today for surgery, with the diagnosis of recurrent left inguinal hernia  The various methods of treatment have been discussed with the patient and family. After consideration of risks, benefits and other options for treatment, the patient has consented to  Procedure(s): REPAIR RECURRENT LEFT INGUINAL SURGERY WITH MESH (Left) INSERTION OF MESH (Left) as a surgical intervention .  The patient's history has been reviewed, patient examined, no change in status, stable for surgery.  I have reviewed the patient's chart and labs.  Questions were answered to the patient's satisfaction.     Alexzander Dolinger E

## 2017-03-06 NOTE — Transfer of Care (Signed)
Immediate Anesthesia Transfer of Care Note  Patient: Reginald Odom  Procedure(s) Performed: Procedure(s): REPAIR RECURRENT LEFT INGUINAL SURGERY WITH MESH (Left) INSERTION OF MESH (Left)  Patient Location: PACU  Anesthesia Type:General  Level of Consciousness: awake, alert , oriented and patient cooperative  Airway & Oxygen Therapy: Patient Spontanous Breathing and Patient connected to nasal cannula oxygen  Post-op Assessment: Report given to RN and Post -op Vital signs reviewed and stable  Post vital signs: Reviewed and stable  Last Vitals:  Vitals:   03/06/17 1143  BP: (!) 151/90  Pulse: 79  Resp: 18  Temp: 36.5 C  SpO2: 99%    Last Pain:  Vitals:   03/06/17 1143  TempSrc: Oral         Complications: No apparent anesthesia complications

## 2017-03-07 ENCOUNTER — Encounter (HOSPITAL_COMMUNITY): Payer: Self-pay | Admitting: General Surgery

## 2017-03-07 NOTE — Anesthesia Postprocedure Evaluation (Signed)
Anesthesia Post Note  Patient: JESIEL GARATE  Procedure(s) Performed: Procedure(s) (LRB): REPAIR RECURRENT LEFT INGUINAL SURGERY WITH MESH (Left) INSERTION OF MESH (Left)     Patient location during evaluation: PACU Anesthesia Type: General Level of consciousness: awake and alert Pain management: pain level controlled Vital Signs Assessment: post-procedure vital signs reviewed and stable Respiratory status: spontaneous breathing, nonlabored ventilation, respiratory function stable and patient connected to nasal cannula oxygen Cardiovascular status: blood pressure returned to baseline and stable Postop Assessment: no signs of nausea or vomiting Anesthetic complications: no    Last Vitals:  Vitals:   03/06/17 1630 03/06/17 1640  BP: (!) 130/95 (!) 146/98  Pulse: 76 72  Resp: 17 17  Temp:  36.7 C  SpO2: 100% 100%    Last Pain:  Vitals:   03/06/17 1740  TempSrc:   PainSc: 5    Pain Goal:                 Kamron Vanwyhe

## 2019-05-20 ENCOUNTER — Other Ambulatory Visit: Payer: Self-pay

## 2019-05-20 DIAGNOSIS — Z20822 Contact with and (suspected) exposure to covid-19: Secondary | ICD-10-CM

## 2019-05-21 LAB — NOVEL CORONAVIRUS, NAA: SARS-CoV-2, NAA: NOT DETECTED

## 2021-07-17 ENCOUNTER — Other Ambulatory Visit: Payer: Self-pay | Admitting: Urology

## 2021-07-27 DIAGNOSIS — F430 Acute stress reaction: Secondary | ICD-10-CM | POA: Diagnosis not present

## 2021-07-27 DIAGNOSIS — E7800 Pure hypercholesterolemia, unspecified: Secondary | ICD-10-CM | POA: Diagnosis not present

## 2021-07-27 DIAGNOSIS — I10 Essential (primary) hypertension: Secondary | ICD-10-CM | POA: Diagnosis not present

## 2021-07-27 DIAGNOSIS — N529 Male erectile dysfunction, unspecified: Secondary | ICD-10-CM | POA: Diagnosis not present

## 2021-07-27 DIAGNOSIS — Z23 Encounter for immunization: Secondary | ICD-10-CM | POA: Diagnosis not present

## 2022-05-30 DIAGNOSIS — N5201 Erectile dysfunction due to arterial insufficiency: Secondary | ICD-10-CM | POA: Diagnosis not present

## 2022-05-30 DIAGNOSIS — E291 Testicular hypofunction: Secondary | ICD-10-CM | POA: Diagnosis not present

## 2022-05-30 DIAGNOSIS — Z125 Encounter for screening for malignant neoplasm of prostate: Secondary | ICD-10-CM | POA: Diagnosis not present

## 2022-07-25 DIAGNOSIS — Z0000 Encounter for general adult medical examination without abnormal findings: Secondary | ICD-10-CM | POA: Diagnosis not present

## 2022-07-25 DIAGNOSIS — I10 Essential (primary) hypertension: Secondary | ICD-10-CM | POA: Diagnosis not present

## 2022-07-25 DIAGNOSIS — E7800 Pure hypercholesterolemia, unspecified: Secondary | ICD-10-CM | POA: Diagnosis not present

## 2023-02-22 DIAGNOSIS — J069 Acute upper respiratory infection, unspecified: Secondary | ICD-10-CM | POA: Diagnosis not present

## 2023-02-22 DIAGNOSIS — B349 Viral infection, unspecified: Secondary | ICD-10-CM | POA: Diagnosis not present

## 2023-05-23 DIAGNOSIS — Z125 Encounter for screening for malignant neoplasm of prostate: Secondary | ICD-10-CM | POA: Diagnosis not present

## 2023-05-23 DIAGNOSIS — N50819 Testicular pain, unspecified: Secondary | ICD-10-CM | POA: Diagnosis not present

## 2023-05-23 DIAGNOSIS — N5201 Erectile dysfunction due to arterial insufficiency: Secondary | ICD-10-CM | POA: Diagnosis not present

## 2023-05-23 DIAGNOSIS — E291 Testicular hypofunction: Secondary | ICD-10-CM | POA: Diagnosis not present

## 2023-05-31 DIAGNOSIS — E291 Testicular hypofunction: Secondary | ICD-10-CM | POA: Diagnosis not present

## 2023-07-10 NOTE — Progress Notes (Deleted)
 Chief Complaint: No chief complaint on file.   History of Present Illness:  Reginald Odom is a 58 y.o. male who is seen in consultation from Hugh Charleston, MD (Inactive) for evaluation of ***.   Past Medical History:  Past Medical History:  Diagnosis Date   Anxiety    Arthritis    Hypertension     Past Surgical History:  Past Surgical History:  Procedure Laterality Date   HERNIA REPAIR     bilateral inguinal hernia as a baby   INGUINAL HERNIA REPAIR Left 03/06/2017   Procedure: REPAIR RECURRENT LEFT INGUINAL SURGERY WITH MESH;  Surgeon: Sebastian Moles, MD;  Location: [facility] OR;  Service: General;  Laterality: Left;   INSERTION OF MESH Left 03/06/2017   Procedure: INSERTION OF MESH;  Surgeon: Sebastian Moles, MD;  Location: [facility] OR;  Service: General;  Laterality: Left;   KNEE SURGERY     removed pieces of glass as a child   VASECTOMY REVERSAL      Allergies:  Allergies  Allergen Reactions   Penicillins     Has patient had a PCN reaction causing immediate rash, facial/tongue/throat swelling, SOB or lightheadedness with hypotension: Unknown Has patient had a PCN reaction causing severe rash involving mucus membranes or skin necrosis: Unknown Has patient had a PCN reaction that required hospitalization: Unknown Has patient had a PCN reaction occurring within the last 10 years: No If all of the above answers are NO, then may proceed with Cephalosporin use.     Family History:  No family history on file.  Social History:  Social History   Tobacco Use   Smoking status: Never   Smokeless tobacco: Never  Vaping Use   Vaping status: Never Used  Substance Use Topics   Alcohol use: Yes    Comment: occasionally   Drug use: No    Review of symptoms:  Constitutional:  Negative for unexplained weight loss, night sweats, fever, chills ENT:  Negative for nose bleeds, sinus pain, painful swallowing CV:  Negative for chest pain, shortness of breath, exercise intolerance,  palpitations, loss of consciousness Resp:  Negative for cough, wheezing, shortness of breath GI:  Negative for nausea, vomiting, diarrhea, bloody stools GU:  Positives noted in HPI; otherwise negative for gross hematuria, dysuria, urinary incontinence Neuro:  Negative for seizures, poor balance, limb weakness, slurred speech Psych:  Negative for lack of energy, depression, anxiety Endocrine:  Negative for polydipsia, polyuria, symptoms of hypoglycemia (dizziness, hunger, sweating) Hematologic:  Negative for anemia, purpura, petechia, prolonged or excessive bleeding, use of anticoagulants  Allergic:  Negative for difficulty breathing or choking as a result of exposure to anything; no shellfish allergy; no allergic response (rash/itch) to materials, foods  Physical exam: There were no vitals taken for this visit. GENERAL APPEARANCE:  Well appearing, well developed, well nourished, NAD HEENT: Atraumatic, Normocephalic, oropharynx clear. NECK: Supple without lymphadenopathy or thyromegaly. LUNGS: Clear to auscultation bilaterally. HEART: Regular Rate and Rhythm without murmurs, gallops, or rubs. ABDOMEN: Soft, non-tender, No Masses. EXTREMITIES: Moves all extremities well.  Without clubbing, cyanosis, or edema. NEUROLOGIC:  Alert and oriented x 3, normal gait, CN II-XII grossly intact.  MENTAL STATUS:  Appropriate. BACK:  Non-tender to palpation.  No CVAT SKIN:  Warm, dry and intact.    Results: No results found for this or any previous visit (from the past 24 hours).  I have reviewed prior patient's records  I have reviewed referring/prior physicians records  I have reviewed urinalysis  I have reviewed  prior urine cultures  I reviewed prior imaging studies  Assessment: No diagnosis found.   Plan: ***

## 2023-07-11 ENCOUNTER — Ambulatory Visit: Payer: Self-pay | Admitting: Urology

## 2023-08-01 DIAGNOSIS — I10 Essential (primary) hypertension: Secondary | ICD-10-CM | POA: Diagnosis not present

## 2023-08-01 DIAGNOSIS — Z131 Encounter for screening for diabetes mellitus: Secondary | ICD-10-CM | POA: Diagnosis not present

## 2023-08-01 DIAGNOSIS — E782 Mixed hyperlipidemia: Secondary | ICD-10-CM | POA: Diagnosis not present

## 2023-08-01 DIAGNOSIS — F430 Acute stress reaction: Secondary | ICD-10-CM | POA: Diagnosis not present

## 2023-08-01 DIAGNOSIS — Z0000 Encounter for general adult medical examination without abnormal findings: Secondary | ICD-10-CM | POA: Diagnosis not present

## 2023-08-01 DIAGNOSIS — E291 Testicular hypofunction: Secondary | ICD-10-CM | POA: Diagnosis not present

## 2023-08-20 NOTE — Progress Notes (Signed)
 08/22/2023 9:40 AM   Reginald Odom 06-05-1966 [id number]     HPI: 58 yo male presenting here for the 1st time. He has previously been seen @ [facility].  He has hypogonadism as well as ED.He injects 100 mg testosterone cypionate weekly.  Recent labs @ [facility]-- Hct 49 T levels (total/free) 155/27.5  Last PSA 05/2022--0.6  He will typically have injections over the weekend-apparently his last injection was by his son 2 to 3 days ago.  He is on small doses of sildenafil.  PMH: Past Medical History:  Diagnosis Date   Anxiety    Arthritis    Hypertension     Surgical History: Past Surgical History:  Procedure Laterality Date   HERNIA REPAIR     bilateral inguinal hernia as a baby   INGUINAL HERNIA REPAIR Left 03/06/2017   Procedure: REPAIR RECURRENT LEFT INGUINAL SURGERY WITH MESH;  Surgeon: Violeta Gelinas, MD;  Location: [facility] OR;  Service: General;  Laterality: Left;   INSERTION OF MESH Left 03/06/2017   Procedure: INSERTION OF MESH;  Surgeon: Violeta Gelinas, MD;  Location: [facility] OR;  Service: General;  Laterality: Left;   KNEE SURGERY     removed pieces of glass as a child   VASECTOMY REVERSAL      Home Medications:  Allergies as of 08/22/2023       Reactions   Penicillins    Has patient had a PCN reaction causing immediate rash, facial/tongue/throat swelling, SOB or lightheadedness with hypotension: Unknown Has patient had a PCN reaction causing severe rash involving mucus membranes or skin necrosis: Unknown Has patient had a PCN reaction that required hospitalization: Unknown Has patient had a PCN reaction occurring within the last 10 years: No If all of the above answers are "NO", then may proceed with Cephalosporin use.        Medication List        Accurate as of August 20, 2023  9:40 AM. If you have any questions, ask your nurse or doctor.          acetaminophen 500 MG tablet Commonly known as: TYLENOL Take 1,000 mg by mouth daily.   ALPRAZolam 0.5 MG  tablet Commonly known as: XANAX Take 0.5 mg by mouth daily as needed for anxiety.   hydrochlorothiazide 25 MG tablet Commonly known as: HYDRODIURIL Take 25 mg by mouth daily.   oxyCODONE 5 MG immediate release tablet Commonly known as: Oxy IR/ROXICODONE Take 1-2 tablets (5-10 mg total) by mouth every 6 (six) hours as needed for moderate pain or severe pain.   testosterone cypionate 100 MG/ML injection Commonly known as: DEPOTESTOTERONE CYPIONATE Inject 50 mg into the muscle every 7 (seven) days. For IM use only Takes 0.5 ml  Every Monday        Allergies:  Allergies  Allergen Reactions   Penicillins     Has patient had a PCN reaction causing immediate rash, facial/tongue/throat swelling, SOB or lightheadedness with hypotension: Unknown Has patient had a PCN reaction causing severe rash involving mucus membranes or skin necrosis: Unknown Has patient had a PCN reaction that required hospitalization: Unknown Has patient had a PCN reaction occurring within the last 10 years: No If all of the above answers are "NO", then may proceed with Cephalosporin use.     Family History: No family history on file.  Social History:  reports that he has never smoked. He has never used smokeless tobacco. He reports current alcohol use. He reports that he does not use drugs.  ROS: All other review of systems were reviewed and are negative except what is noted above in HPI  Physical Exam: There were no vitals taken for this visit.  Constitutional:  Alert and oriented, No acute distress. HEENT: [place] AT, moist mucus membranes.  Trachea midline, no masses. Cardiovascular: No clubbing, cyanosis, or edema. Respiratory: Normal respiratory effort, no increased work of breathing. Skin: No rashes, bruises or suspicious lesions. Neurologic: Grossly intact, no focal deficits, moving all 4 extremities. Psychiatric: Normal mood and affect.  Laboratory Data: Lab Results  Component Value Date   WBC  6.3 03/02/2017   HGB 16.4 03/02/2017   HCT 49.1 03/02/2017   MCV 83.9 03/02/2017   PLT 168 03/02/2017    Lab Results  Component Value Date   CREATININE 0.91 03/02/2017    No results found for: "PSA"  No results found for: "TESTOSTERONE"  No results found for: "HGBA1C"  Urinalysis No results found for: "COLORURINE", "APPEARANCEUR", "LABSPEC", "PHURINE", "GLUCOSEU", "HGBUR", "BILIRUBINUR", "KETONESUR", "PROTEINUR", "UROBILINOGEN", "NITRITE", "LEUKOCYTESUR"  No results found for: "LABMICR", "WBCUA", "RBCUA", "LABEPIT", "MUCUS", "BACTERIA"  [facility]  Prior PSA, testosterone (free/total), hemoglobin/hematocrit reviewed     Assessment: 1.  Hypogonadism, on testosterone 100 mg weekly.  Not always compliant with injecting the same day, most recent testosterone level subtherapeutic  2.  ED, on sildenafil  Plan: 1.  I strongly recommend that he continue injecting, but the same day every week  2.  Sildenafil 20 mg refilled  3.  Testosterone--Free/total drawn today  4.  I will see back in a year There are no diagnoses linked to this encounter.  No follow-ups on file.  Chelsea Aus, MD  [facility] [place]

## 2023-08-22 ENCOUNTER — Ambulatory Visit: Payer: Self-pay | Admitting: Urology

## 2023-08-22 ENCOUNTER — Telehealth: Payer: Self-pay

## 2023-08-22 VITALS — BP 148/89 | HR 79

## 2023-08-22 DIAGNOSIS — E291 Testicular hypofunction: Secondary | ICD-10-CM

## 2023-08-22 DIAGNOSIS — N5201 Erectile dysfunction due to arterial insufficiency: Secondary | ICD-10-CM

## 2023-08-22 MED ORDER — SILDENAFIL CITRATE 20 MG PO TABS: ORAL_TABLET | ORAL | 11 refills | Status: AC

## 2023-08-22 NOTE — Telephone Encounter (Signed)
 Medication prior authorization request received.  Completed PA request through cover my meds for drug Sildenafil Citrate 20mg  Tablets. JYN:[id number]  Approved: Pending

## 2023-08-26 LAB — TESTOSTERONE,FREE AND TOTAL
Testosterone, Free: 24.2 pg/mL — ABNORMAL HIGH (ref 7.2–24.0)
Testosterone: 1197 ng/dL — ABNORMAL HIGH (ref 264–916)

## 2023-08-28 ENCOUNTER — Other Ambulatory Visit: Payer: Self-pay | Admitting: Urology

## 2023-08-28 ENCOUNTER — Encounter: Payer: Self-pay | Admitting: Urology

## 2023-08-28 DIAGNOSIS — E291 Testicular hypofunction: Secondary | ICD-10-CM

## 2023-08-28 NOTE — Telephone Encounter (Signed)
 FYI

## 2024-01-29 DIAGNOSIS — E782 Mixed hyperlipidemia: Secondary | ICD-10-CM | POA: Diagnosis not present

## 2024-01-29 DIAGNOSIS — I10 Essential (primary) hypertension: Secondary | ICD-10-CM | POA: Diagnosis not present

## 2024-01-29 DIAGNOSIS — F430 Acute stress reaction: Secondary | ICD-10-CM | POA: Diagnosis not present

## 2024-04-15 ENCOUNTER — Other Ambulatory Visit: Payer: Self-pay

## 2024-04-15 DIAGNOSIS — E291 Testicular hypofunction: Secondary | ICD-10-CM

## 2024-04-15 NOTE — Telephone Encounter (Signed)
 Called pt to f/u on med refill request and to schedule needed labs per MD request pt confirmed scheduled lab appt and advised message would be sent to MD as an FYI

## 2024-04-15 NOTE — Telephone Encounter (Signed)
 The patient called to request a medication refill of  testosterone  cypionate (DEPOTESTOTERONE CYPIONATE) 100 MG/ML injection .  Patient is out.  Patient would like the medication sent to   [facility] [place], [place] - [address] Phone: [phone]  Fax: [phone]      Needing testosterone  levels checked.  Asking if he would need a follow up visit?  Please advise.  Call:  [phone]

## 2024-04-16 MED ORDER — TESTOSTERONE CYPIONATE 100 MG/ML IM SOLN: INTRAMUSCULAR | 0 refills | Status: DC

## 2024-04-19 ENCOUNTER — Telehealth: Payer: Self-pay

## 2024-04-19 ENCOUNTER — Other Ambulatory Visit: Payer: Self-pay | Admitting: Urology

## 2024-04-19 DIAGNOSIS — E291 Testicular hypofunction: Secondary | ICD-10-CM

## 2024-04-19 MED ORDER — TESTOSTERONE CYPIONATE 200 MG/ML IM SOLN: INTRAMUSCULAR | 0 refills | Status: AC

## 2024-04-19 NOTE — Telephone Encounter (Signed)
 Pt called to let us  know that [facility] did not have the needed Rx anymore and could not fill his Rx. I called [facility] and spoke with Andrea a pharmacist there that stated they could fill testosterone  just not the specific requested Rx she let me know they can only receive 200mg /mL vials now instead of the 100g/mL and if provider could correct the Rx to meet what they have pt Rx can be filled. Pt was notified of pharmacist recommendations and advised MD Dahlstedt would be messaged to correct the Rx

## 2024-04-19 NOTE — Telephone Encounter (Signed)
 After hours triage note placed in media  Per note, Caller requesting a call back regarding a medication that was sent in by of office and is unable to fill. Medication not specified.

## 2024-04-19 NOTE — Telephone Encounter (Signed)
 Called pt to let him know MD Dahlstedt sent in a Rx for testosterone  upon review the Rx was sent to the incorrect pharmacy pt wanted to know if [facility] would fill his Rx [facility] called [facility] and spoke with a pharmacist who stated they had it in stock and could fill Rx pt called and confirmed that he would get it at [facility] and pay out of pocket cost [facility] pharmacist was notified [facility]

## 2024-04-19 NOTE — Telephone Encounter (Signed)
 Please see 10/24 other telephone encounter for details

## 2024-05-17 ENCOUNTER — Other Ambulatory Visit

## 2024-05-17 DIAGNOSIS — E291 Testicular hypofunction: Secondary | ICD-10-CM

## 2024-05-19 LAB — TESTOSTERONE,FREE AND TOTAL
Testosterone, Free: 12.2 pg/mL (ref 7.2–24.0)
Testosterone: 646 ng/dL (ref 264–916)

## 2024-05-20 ENCOUNTER — Ambulatory Visit: Payer: Self-pay | Admitting: Urology

## 2024-07-31 ENCOUNTER — Other Ambulatory Visit: Payer: Self-pay | Admitting: Urology

## 2024-07-31 DIAGNOSIS — N5201 Erectile dysfunction due to arterial insufficiency: Secondary | ICD-10-CM

## 2024-08-27 ENCOUNTER — Ambulatory Visit: Payer: BC Managed Care – PPO | Admitting: Urology
